# Patient Record
Sex: Male | Born: 1980 | Race: Black or African American | Hispanic: No | Marital: Married | Smoking: Current every day smoker
Health system: Southern US, Community
[De-identification: ages and names within clinical notes are randomized; demographics above are authoritative.]

---

## 2009-05-13 ENCOUNTER — Emergency Department (HOSPITAL_COMMUNITY): Admission: EM | Admit: 2009-05-13 | Discharge: 2009-05-13 | Payer: Self-pay | Admitting: Family Medicine

## 2010-07-28 LAB — RPR: RPR Ser Ql: NONREACTIVE

## 2010-07-28 LAB — GC/CHLAMYDIA PROBE AMP, URINE: GC Probe Amp, Urine: NEGATIVE

## 2015-01-16 ENCOUNTER — Emergency Department (HOSPITAL_BASED_OUTPATIENT_CLINIC_OR_DEPARTMENT_OTHER)
Admission: EM | Admit: 2015-01-16 | Discharge: 2015-01-16 | Disposition: A | Discharge status: Home / Self Care | Payer: BLUE CROSS/BLUE SHIELD | Attending: Emergency Medicine | Admitting: Emergency Medicine

## 2015-01-16 ENCOUNTER — Encounter (HOSPITAL_BASED_OUTPATIENT_CLINIC_OR_DEPARTMENT_OTHER): Payer: Self-pay

## 2015-01-16 DIAGNOSIS — S40812A Abrasion of left upper arm, initial encounter: Secondary | ICD-10-CM | POA: Diagnosis not present

## 2015-01-16 DIAGNOSIS — Y998 Other external cause status: Secondary | ICD-10-CM | POA: Insufficient documentation

## 2015-01-16 DIAGNOSIS — S50812A Abrasion of left forearm, initial encounter: Secondary | ICD-10-CM | POA: Insufficient documentation

## 2015-01-16 DIAGNOSIS — Y9389 Activity, other specified: Secondary | ICD-10-CM | POA: Diagnosis not present

## 2015-01-16 DIAGNOSIS — S40811A Abrasion of right upper arm, initial encounter: Secondary | ICD-10-CM

## 2015-01-16 DIAGNOSIS — S50811A Abrasion of right forearm, initial encounter: Secondary | ICD-10-CM | POA: Insufficient documentation

## 2015-01-16 DIAGNOSIS — R000 Tachycardia, unspecified: Secondary | ICD-10-CM | POA: Insufficient documentation

## 2015-01-16 DIAGNOSIS — Y92410 Unspecified street and highway as the place of occurrence of the external cause: Secondary | ICD-10-CM | POA: Diagnosis not present

## 2015-01-16 DIAGNOSIS — Z720 Tobacco use: Secondary | ICD-10-CM | POA: Insufficient documentation

## 2015-01-16 DIAGNOSIS — Z23 Encounter for immunization: Secondary | ICD-10-CM | POA: Diagnosis not present

## 2015-01-16 MED ORDER — TETANUS-DIPHTH-ACELL PERTUSSIS 5-2.5-18.5 LF-MCG/0.5 IM SUSP
0.5000 mL | Freq: Once | INTRAMUSCULAR | Status: AC
Start: 2015-01-16 — End: 2015-01-16
  Administered 2015-01-16: 0.5 mL via INTRAMUSCULAR
  Filled 2015-01-16: qty 0.5
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
## 2015-01-16 NOTE — Discharge Instructions (Signed)
You may take ibuprofen or naproxen every 6-8 hours as needed for pain. Continue applying bacitracin.  Abrasion An abrasion is a cut or scrape of the skin. Abrasions do not extend through all layers of the skin and most heal within 10 days. It is important to care for your abrasion properly to prevent infection. CAUSES  Most abrasions are caused by falling on, or gliding across, the ground or other surface. When your skin rubs on something, the outer and inner layer of skin rubs off, causing an abrasion. DIAGNOSIS  Your caregiver will be able to diagnose an abrasion during a physical exam.  TREATMENT  Your treatment depends on how large and deep the abrasion is. Generally, your abrasion will be cleaned with water and a mild soap to remove any dirt or debris. An antibiotic ointment may be put over the abrasion to prevent an infection. A bandage (dressing) may be wrapped around the abrasion to keep it from getting dirty.  You may need a tetanus shot if:  You cannot remember when you had your last tetanus shot.  You have never had a tetanus shot.  The injury broke your skin. If you get a tetanus shot, your arm may swell, get red, and feel warm to the touch. This is common and not a problem. If you need a tetanus shot and you choose not to have one, there is a rare chance of getting tetanus. Sickness from tetanus can be serious.  HOME CARE INSTRUCTIONS   If a dressing was applied, change it at least once a day or as directed by your caregiver. If the bandage sticks, soak it off with warm water.   Wash the area with water and a mild soap to remove all the ointment 2 times a day. Rinse off the soap and pat the area dry with a clean towel.   Reapply any ointment as directed by your caregiver. This will help prevent infection and keep the bandage from sticking. Use gauze over the wound and under the dressing to help keep the bandage from sticking.   Change your dressing right away if it becomes  wet or dirty.   Only take over-the-counter or prescription medicines for pain, discomfort, or fever as directed by your caregiver.   Follow up with your caregiver within 24-48 hours for a wound check, or as directed. If you were not given a wound-check appointment, look closely at your abrasion for redness, swelling, or pus. These are signs of infection. SEEK IMMEDIATE MEDICAL CARE IF:   You have increasing pain in the wound.   You have redness, swelling, or tenderness around the wound.   You have pus coming from the wound.   You have a fever or persistent symptoms for more than 2-3 days.  You have a fever and your symptoms suddenly get worse.  You have a bad smell coming from the wound or dressing.  MAKE SURE YOU:   Understand these instructions.  Will watch your condition.  Will get help right away if you are not doing well or get worse. Document Released: 02/05/2005 Document Revised: 04/14/2012 Document Reviewed: 04/01/2011 [facility] Patient Information 2015 [place], [place]. This information is not intended to replace advice given to you by your health care provider. Make sure you discuss any questions you have with your health care provider.

## 2017-05-19 ENCOUNTER — Ambulatory Visit: Payer: BLUE CROSS/BLUE SHIELD | Admitting: Internal Medicine

## 2017-06-23 ENCOUNTER — Other Ambulatory Visit: Payer: Self-pay

## 2017-06-23 ENCOUNTER — Emergency Department (HOSPITAL_BASED_OUTPATIENT_CLINIC_OR_DEPARTMENT_OTHER)
Admission: EM | Admit: 2017-06-23 | Discharge: 2017-06-23 | Disposition: A | Discharge status: Home / Self Care | Payer: BLUE CROSS/BLUE SHIELD | Attending: Emergency Medicine | Admitting: Emergency Medicine

## 2017-06-23 ENCOUNTER — Encounter (HOSPITAL_BASED_OUTPATIENT_CLINIC_OR_DEPARTMENT_OTHER): Payer: Self-pay | Admitting: *Deleted

## 2017-06-23 DIAGNOSIS — Y929 Unspecified place or not applicable: Secondary | ICD-10-CM | POA: Diagnosis not present

## 2017-06-23 DIAGNOSIS — S3992XA Unspecified injury of lower back, initial encounter: Secondary | ICD-10-CM | POA: Diagnosis present

## 2017-06-23 DIAGNOSIS — F17200 Nicotine dependence, unspecified, uncomplicated: Secondary | ICD-10-CM | POA: Insufficient documentation

## 2017-06-23 DIAGNOSIS — Y990 Civilian activity done for income or pay: Secondary | ICD-10-CM | POA: Diagnosis not present

## 2017-06-23 DIAGNOSIS — S39012A Strain of muscle, fascia and tendon of lower back, initial encounter: Secondary | ICD-10-CM | POA: Diagnosis not present

## 2017-06-23 DIAGNOSIS — X500XXA Overexertion from strenuous movement or load, initial encounter: Secondary | ICD-10-CM | POA: Insufficient documentation

## 2017-06-23 DIAGNOSIS — Y939 Activity, unspecified: Secondary | ICD-10-CM | POA: Diagnosis not present

## 2017-06-23 DIAGNOSIS — T148XXA Other injury of unspecified body region, initial encounter: Secondary | ICD-10-CM

## 2017-06-23 MED ORDER — METHYLPREDNISOLONE 4 MG PO TBPK: ORAL_TABLET | ORAL | 0 refills | Status: AC

## 2017-06-23 NOTE — ED Provider Notes (Signed)
[facility] EMERGENCY DEPARTMENT Provider Note   CSN: [id number] Arrival date & time: 06/23/17  1536     History   Chief Complaint Chief Complaint  Patient presents with  . Back Pain    HPI Henry Singleton is a 37 y.o. male who presents to ED for evaluation of right-sided lower back pain for the past day.  He states that he twisted, bent over to pick something up off the ground while at work and felt a sharp pain on the right side.  He went to sleep last night and then woke this morning with worsening pain.  He took 1 dose of Flexeril with no improvement in his symptoms.  He does have history of low back pain and feels like this is a flareup of that.  He reports pain worse with movement and palpation.  He denies any previous back surgeries, history of cancer, history of IV drug use, fevers, numbness in legs, loss of bowel or bladder function, dysuria or hematuria.  Denies any changes in gait.  HPI  History reviewed. No pertinent past medical history.  There are no active problems to display for this patient.   History reviewed. No pertinent surgical history.     Home Medications    Prior to Admission medications   Medication Sig Start Date End Date Taking? Authorizing Provider  methylPREDNISolone (MEDROL DOSEPAK) 4 MG TBPK tablet Taper over 6 days. 06/23/17   Dietrich PatesKhatri, Harman Ferrin, PA-C    Family History No family history on file.  Social History Social History   Tobacco Use  . Smoking status: Current Every Day Smoker  . Smokeless tobacco: Never Used  Substance Use Topics  . Alcohol use: Yes    Comment: occ  . Drug use: No     Allergies   Patient has no known allergies.   Review of Systems Review of Systems  Constitutional: Negative for chills and fever.  Genitourinary: Negative for dysuria and flank pain.  Musculoskeletal: Positive for back pain and myalgias. Negative for arthralgias, gait problem, joint swelling, neck pain and neck stiffness.  Skin:  Negative for wound.  Neurological: Negative for weakness and numbness.     Physical Exam Updated Vital Signs BP (!) 141/91 (BP Location: Right Arm)   Pulse 88   Temp 98.5 F (36.9 C) (Oral)   Resp 18   Ht 5\' 9"  (1.753 m)   Wt 104.8 kg (231 lb)   SpO2 100%   BMI 34.11 kg/m   Physical Exam  Constitutional: He appears well-developed and well-nourished. No distress.  Nontoxic appearing and in no acute distress.  Ambulatory with normal gait.  HENT:  Head: Normocephalic and atraumatic.  Eyes: Conjunctivae and EOM are normal. No scleral icterus.  Neck: Normal range of motion.  Pulmonary/Chest: Effort normal. No respiratory distress.  Musculoskeletal: Normal range of motion. He exhibits tenderness. He exhibits no edema or deformity.       Back:  No midline spinal tenderness present in lumbar, thoracic or cervical spine. No step-off palpated. No visible bruising, edema or temperature change noted. No objective signs of numbness present. No saddle anesthesia. 2+ DP pulses bilaterally. Sensation intact to light touch. Strength 5/5 in bilateral lower extremities.  Neurological: He is alert.  Skin: No rash noted. He is not diaphoretic.  Psychiatric: He has a normal mood and affect.  Nursing note and vitals reviewed.    ED Treatments / Results  Labs (all labs ordered are listed, but only abnormal results are displayed)  Labs Reviewed - No data to display  EKG  EKG Interpretation None       Radiology No results found.  Procedures Procedures (including critical care time)  Medications Ordered in ED Medications - No data to display   Initial Impression / Assessment and Plan / ED Course  I have reviewed the triage vital signs and the nursing notes.  Pertinent labs & imaging results that were available during my care of the patient were reviewed by me and considered in my medical decision making (see chart for details).     Patient presents to ED for evaluation of 1 day  history of right-sided back pain.  Symptoms began when he was bending down to lift up an object at work.  Denies any red flags for back pain including midline tenderness, loss of bowel or bladder function, numbness in legs, history of cancer, history of IV drug use or previous back surgeries.  He has been ambulatory with normal gait here in the ED without difficulty.  He does have muscular tenderness on the right paraspinal musculature but no midline tenderness.  He is neurovascularly intact in bilateral lower extremities.  Suspect that his symptoms are due to a lumbar strain. Low suspicion for cauda equina or other acute spinal cord injury or infectious process as a cause of his symptoms.  Will give symptomatic treatment with steroids and advised him to continue muscle relaxer as needed.  Patient appears stable for discharge at this time.  Strict return precautions given.  Portions of this note were generated with Scientist, clinical (histocompatibility and immunogenetics). Dictation errors may occur despite best attempts at proofreading.   Final Clinical Impressions(s) / ED Diagnoses   Final diagnoses:  Strain of lumbar region, initial encounter  Muscle strain    ED Discharge Orders        Ordered    methylPREDNISolone (MEDROL DOSEPAK) 4 MG TBPK tablet     06/23/17 1759       Dietrich Pates, PA-C 06/23/17 1803    Doug Sou, MD 06/24/17 [phone]

## 2017-06-23 NOTE — ED Triage Notes (Signed)
Yesterday he bent over to lift something and felt pain in his lower back. Woke with am with worsened pain.

## 2017-06-23 NOTE — ED Notes (Signed)
ED Provider at bedside. 

## 2017-06-23 NOTE — Discharge Instructions (Signed)
Take steroids in tapered dose pack as directed.  Return to ED for worsening back pain, numbness in legs, inability to walk, injuries or falls, loss of bowel or bladder function.

## 2017-10-28 ENCOUNTER — Encounter

## 2020-05-25 ENCOUNTER — Other Ambulatory Visit: Payer: Self-pay

## 2020-05-25 ENCOUNTER — Other Ambulatory Visit: Payer: Self-pay | Admitting: Family Medicine

## 2020-05-25 ENCOUNTER — Ambulatory Visit: Payer: Self-pay

## 2020-05-25 DIAGNOSIS — Z0000 Encounter for general adult medical examination without abnormal findings: Secondary | ICD-10-CM

## 2022-03-16 IMAGING — DX DG CHEST 1V
1 series · 1 of 1 positions shown · non-contrast
Comparison: None.

CLINICAL DATA: Physical exam.  History of tobacco use.

EXAM:
CHEST  1 VIEW

[chest pa]
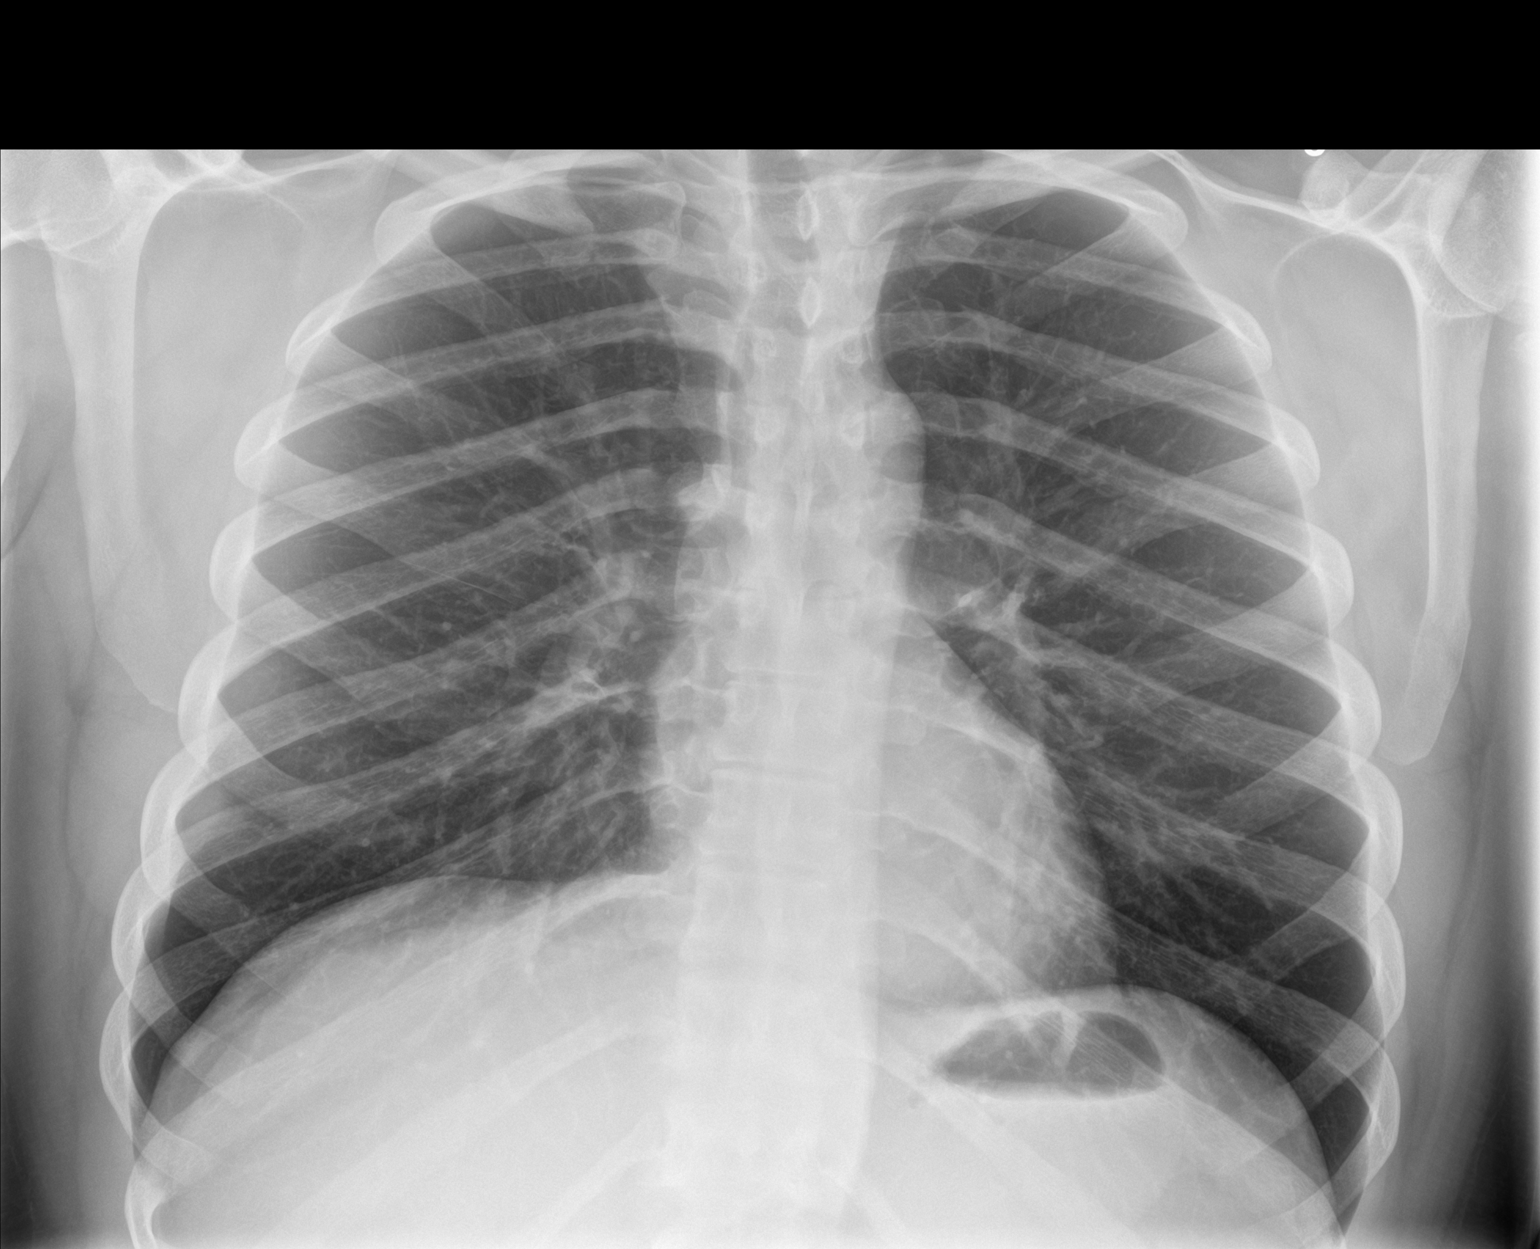

[1 of 1 positions shown; findings below may reference images not displayed]

FINDINGS: The heart size and mediastinal contours are within normal limits.
Both lungs are clear. The visualized skeletal structures are
unremarkable.
IMPRESSION: No active disease.

## 2022-04-29 ENCOUNTER — Other Ambulatory Visit: Payer: Self-pay

## 2022-04-29 ENCOUNTER — Emergency Department (HOSPITAL_BASED_OUTPATIENT_CLINIC_OR_DEPARTMENT_OTHER): Payer: 59

## 2022-04-29 ENCOUNTER — Emergency Department (HOSPITAL_BASED_OUTPATIENT_CLINIC_OR_DEPARTMENT_OTHER)
Admission: EM | Admit: 2022-04-29 | Discharge: 2022-04-29 | Disposition: A | Discharge status: Home / Self Care | Payer: 59 | Attending: Emergency Medicine | Admitting: Emergency Medicine

## 2022-04-29 DIAGNOSIS — W231XXA Caught, crushed, jammed, or pinched between stationary objects, initial encounter: Secondary | ICD-10-CM | POA: Diagnosis not present

## 2022-04-29 DIAGNOSIS — S61311A Laceration without foreign body of left index finger with damage to nail, initial encounter: Secondary | ICD-10-CM | POA: Insufficient documentation

## 2022-04-29 DIAGNOSIS — Z23 Encounter for immunization: Secondary | ICD-10-CM | POA: Insufficient documentation

## 2022-04-29 DIAGNOSIS — S61309A Unspecified open wound of unspecified finger with damage to nail, initial encounter: Secondary | ICD-10-CM

## 2022-04-29 DIAGNOSIS — S6992XA Unspecified injury of left wrist, hand and finger(s), initial encounter: Secondary | ICD-10-CM | POA: Diagnosis present

## 2022-04-29 MED ORDER — HYDROCODONE-ACETAMINOPHEN 5-325 MG PO TABS: 1.0000 | ORAL_TABLET | Freq: Four times a day (QID) | ORAL | 0 refills | Status: AC | PRN

## 2022-04-29 MED ORDER — LIDOCAINE HCL (PF) 1 % IJ SOLN
5.0000 mL | Freq: Once | INTRAMUSCULAR | Status: AC
  Administered 2022-04-29: 5 mL
  Filled 2022-04-29: qty 5

## 2022-04-29 MED ORDER — TETANUS-DIPHTH-ACELL PERTUSSIS 5-2.5-18.5 LF-MCG/0.5 IM SUSY
0.5000 mL | PREFILLED_SYRINGE | Freq: Once | INTRAMUSCULAR | Status: AC
  Administered 2022-04-29: 0.5 mL via INTRAMUSCULAR
  Filled 2022-04-29: qty 0.5

## 2022-04-29 NOTE — ED Triage Notes (Signed)
Patient presents to ED via POV from home. Patient here after sustaining an injury to his left pointer finger. Laceration through nail bed. Oozing noted. Positive PMS.

## 2022-04-29 NOTE — Discharge Instructions (Addendum)
Remove the bandage tomorrow and clean gently with soap and water.  Placed a new bandage.  If there are any concerns about healing follow-up with the hand surgeon.  It is okay for you to turn to work but make sure you wear the splint while at work to protect your finger.

## 2022-04-29 NOTE — ED Provider Notes (Signed)
[facility] EMERGENCY DEPARTMENT Provider Note   CSN: [id number] Arrival date & time: 04/29/22  1047     History  Chief Complaint  Patient presents with   Finger Injury    Henry Singleton is a 41 y.o. male.  Patient is a 41 year old male with no significant medical problems presenting today from work after his finger got smashed between a belt.  He pulled his finger out quickly but still injured his left index finger.  No injury anywhere else.  Last tetanus shot was 2016.  The history is provided by the patient.       Home Medications Prior to Admission medications   Medication Sig Start Date End Date Taking? Authorizing Provider  HYDROcodone-acetaminophen (NORCO/VICODIN) 5-325 MG tablet Take 1 tablet by mouth every 6 (six) hours as needed for severe pain. 04/29/22  Yes Jessyka Austria, Alphonzo Lemmings, MD  methylPREDNISolone (MEDROL DOSEPAK) 4 MG TBPK tablet Taper over 6 days. 06/23/17   Dietrich Pates, PA-C      Allergies    Patient has no known allergies.    Review of Systems   Review of Systems  Physical Exam Updated Vital Signs BP 136/88   Pulse 66   Temp 98 F (36.7 C) (Oral)   Resp 18   SpO2 100%  Physical Exam Vitals and nursing note reviewed.  HENT:     Head: Normocephalic.  Cardiovascular:     Rate and Rhythm: Normal rate.  Pulmonary:     Effort: Pulmonary effort is normal.  Musculoskeletal:       Hands:  Neurological:     Mental Status: He is alert. Mental status is at baseline.  Psychiatric:        Mood and Affect: Mood normal.     ED Results / Procedures / Treatments   Labs (all labs ordered are listed, but only abnormal results are displayed) Labs Reviewed - No data to display  EKG None  Radiology DG Finger Index Left  Result Date: 04/29/2022 CLINICAL DATA:  Injury. Gloved got stuck in belt of car this morning. EXAM: LEFT INDEX FINGER 2+V COMPARISON:  None available FINDINGS: There is an oblique fracture within the proximal metaphysis  and diaphysis of the distal phalanx of the index finger, extending in a proximal volar to distal dorsal orientation on lateral view and contacting the volar, dorsal, medial, and lateral bone cortices. Up to 1 mm dorsal cortical step-off on lateral view. Normal alignment on frontal view. No definite intra-articular extension. Lucency and irregularity from soft tissue injury at the distal dorsal aspect of the index finger. IMPRESSION: Oblique extra-articular acute fracture of the proximal aspect of the distal phalanx of the index finger. Minimal dorsal cortical step-off but no significant displacement. Electronically Signed   By: Neita Garnet M.D.   On: 04/29/2022 11:19    Procedures Procedures   LACERATION REPAIR Performed by: [facility] Authorized by: Gwyneth Sprout Consent: Verbal consent obtained. Risks and benefits: risks, benefits and alternatives were discussed Consent given by: patient Patient identity confirmed: provided demographic data Prepped and Draped in normal sterile fashion Wound explored  Laceration Location: left index finger nailbed  Laceration Length: 0.5cm  No Foreign Bodies seen or palpated  Anesthesia: digital block Local anesthetic: lidocaine 2% without epinephrine  Anesthetic total: 5 ml  Irrigation method: syringe Amount of cleaning: standard  Skin closure: 5.0 vicryl rapide  Number of sutures: 1  Technique: figure of 8  Patient tolerance: Patient tolerated the procedure well with no immediate complications.  Medications Ordered in ED Medications  lidocaine (PF) (XYLOCAINE) 1 % injection 5 mL (5 mLs Infiltration Given 04/29/22 1316)  Tdap (BOOSTRIX) injection 0.5 mL (0.5 mLs Intramuscular Given 04/29/22 1317)    ED Course/ Medical Decision Making/ A&P                           Medical Decision Making Amount and/or Complexity of Data Reviewed Radiology: ordered and independent interpretation performed. Decision-making details  documented in ED Course.  Risk Prescription drug management.   Patient presenting today with finger injury after getting caught in a machine.  Tetanus shot was last 2016 and was updated today.  Wound repaired as above.  I have independently visualized and interpreted pt's images today.  X-ray today shows a fracture of the distal phalanx.  Radiology reports oblique extra-articular acute fracture of the proximal aspect of the distal phalanx of the index finger without significant displacement. Nail was completely avulsed and no cuticle left to put pt's nail back on.        Final Clinical Impression(s) / ED Diagnoses Final diagnoses:  Laceration of left index finger without foreign body with damage to nail, initial encounter  Nail avulsion, finger, initial encounter    Rx / DC Orders ED Discharge Orders          Ordered    HYDROcodone-acetaminophen (NORCO/VICODIN) 5-325 MG tablet  Every 6 hours PRN        04/29/22 1359              Blanchie Dessert, MD 04/29/22 1359
